# Patient Record
Sex: Female | Born: 1975 | Race: Asian | Hispanic: No | Marital: Married | Smoking: Never smoker
Health system: Southern US, Community
[De-identification: ages and names within clinical notes are randomized; demographics above are authoritative.]

## PROBLEM LIST (undated history)

## (undated) HISTORY — PX: APPENDECTOMY: SHX54

---

## 2020-07-06 ENCOUNTER — Ambulatory Visit: Payer: 59 | Admitting: Medical

## 2020-07-06 ENCOUNTER — Other Ambulatory Visit: Payer: Self-pay

## 2020-07-06 ENCOUNTER — Encounter: Payer: Self-pay | Admitting: Medical

## 2020-07-06 VITALS — BP 120/75 | HR 70 | Temp 97.5°F | Resp 20 | Ht 60.0 in | Wt 135.0 lb

## 2020-07-06 DIAGNOSIS — Z1239 Encounter for other screening for malignant neoplasm of breast: Secondary | ICD-10-CM

## 2020-07-06 DIAGNOSIS — Z0000 Encounter for general adult medical examination without abnormal findings: Secondary | ICD-10-CM

## 2020-07-06 NOTE — Patient Instructions (Addendum)
For you wellness exam today I have ordered cbc, cmp and  lipid panel.  Flu vaccine deferred. Pt wants to get on a Friday in future. If you got tetanus please give me dates.  Also sign release of information form so we can get old records.  Recommend exercise and healthy diet.  We will let you know lab results as they come in.  Bp was initiially high but then it was better on recheck. You can check bp at home occasionaly. If bp above 130/80 then beginning to have htn.  Follow up date appointment will be determined after lab review.    Preventive Care 74-46 Years Old, Female Preventive care refers to visits with your health care provider and lifestyle choices that can promote health and wellness. This includes:  A yearly physical exam. This may also be called an annual well check.  Regular dental visits and eye exams.  Immunizations.  Screening for certain conditions.  Healthy lifestyle choices, such as eating a healthy diet, getting regular exercise, not using drugs or products that contain nicotine and tobacco, and limiting alcohol use. What can I expect for my preventive care visit? Physical exam Your health care provider will check your:  Height and weight. This may be used to calculate body mass index (BMI), which tells if you are at a healthy weight.  Heart rate and blood pressure.  Skin for abnormal spots. Counseling Your health care provider may ask you questions about your:  Alcohol, tobacco, and drug use.  Emotional well-being.  Home and relationship well-being.  Sexual activity.  Eating habits.  Work and work Statistician.  Method of birth control.  Menstrual cycle.  Pregnancy history. What immunizations do I need?  Influenza (flu) vaccine  This is recommended every year. Tetanus, diphtheria, and pertussis (Tdap) vaccine  You may need a Td booster every 10 years. Varicella (chickenpox) vaccine  You may need this if you have not been  vaccinated. Zoster (shingles) vaccine  You may need this after age 46. Measles, mumps, and rubella (MMR) vaccine  You may need at least one dose of MMR if you were born in 1957 or later. You may also need a second dose. Pneumococcal conjugate (PCV13) vaccine  You may need this if you have certain conditions and were not previously vaccinated. Pneumococcal polysaccharide (PPSV23) vaccine  You may need one or two doses if you smoke cigarettes or if you have certain conditions. Meningococcal conjugate (MenACWY) vaccine  You may need this if you have certain conditions. Hepatitis A vaccine  You may need this if you have certain conditions or if you travel or work in places where you may be exposed to hepatitis A. Hepatitis B vaccine  You may need this if you have certain conditions or if you travel or work in places where you may be exposed to hepatitis B. Haemophilus influenzae type b (Hib) vaccine  You may need this if you have certain conditions. Human papillomavirus (HPV) vaccine  If recommended by your health care provider, you may need three doses over 6 months. You may receive vaccines as individual doses or as more than one vaccine together in one shot (combination vaccines). Talk with your health care provider about the risks and benefits of combination vaccines. What tests do I need? Blood tests  Lipid and cholesterol levels. These may be checked every 5 years, or more frequently if you are over 46 years old.  Hepatitis C test.  Hepatitis B test. Screening  Lung cancer  screening. You may have this screening every year starting at age 46 if you have a 30-pack-year history of smoking and currently smoke or have quit within the past 15 years.  Colorectal cancer screening. All adults should have this screening starting at age 30 and continuing until age 46. Your health care provider may recommend screening at age 46 if you are at increased risk. You will have tests every  1-10 years, depending on your results and the type of screening test.  Diabetes screening. This is done by checking your blood sugar (glucose) after you have not eaten for a while (fasting). You may have this done every 1-3 years.  Mammogram. This may be done every 1-2 years. Talk with your health care provider about when you should start having regular mammograms. This may depend on whether you have a family history of breast cancer.  BRCA-related cancer screening. This may be done if you have a family history of breast, ovarian, tubal, or peritoneal cancers.  Pelvic exam and Pap test. This may be done every 3 years starting at age 46. Starting at age 46, this may be done every 5 years if you have a Pap test in combination with an HPV test. Other tests  Sexually transmitted disease (STD) testing.  Bone density scan. This is done to screen for osteoporosis. You may have this scan if you are at high risk for osteoporosis. Follow these instructions at home: Eating and drinking  Eat a diet that includes fresh fruits and vegetables, whole grains, lean protein, and low-fat dairy.  Take vitamin and mineral supplements as recommended by your health care provider.  Do not drink alcohol if: ? Your health care provider tells you not to drink. ? You are pregnant, may be pregnant, or are planning to become pregnant.  If you drink alcohol: ? Limit how much you have to 0-1 drink a day. ? Be aware of how much alcohol is in your drink. In the [place] one drink equals one 12 oz bottle of beer (355 mL), one 5 oz glass of wine (148 mL), or one 1 oz glass of hard liquor (44 mL). Lifestyle  Take daily care of your teeth and gums.  Stay active. Exercise for at least 30 minutes on 5 or more days each week.  Do not use any products that contain nicotine or tobacco, such as cigarettes, e-cigarettes, and chewing tobacco. If you need help quitting, ask your health care provider.  If you are sexually active,  practice safe sex. Use a condom or other form of birth control (contraception) in order to prevent pregnancy and STIs (sexually transmitted infections).  If told by your health care provider, take low-dose aspirin daily starting at age 38. What's next?  Visit your health care provider once a year for a well check visit.  Ask your health care provider how often you should have your eyes and teeth checked.  Stay up to date on all vaccines. This information is not intended to replace advice given to you by your health care provider. Make sure you discuss any questions you have with your health care provider. Document Revised: 05/17/2018 Document Reviewed: 05/17/2018 Elsevier Patient Education  2020 [employer].

## 2020-07-06 NOTE — Progress Notes (Signed)
Subjective:    Patient ID: Sara Rogers, female    DOB: [birth year], 44 y.o.   MRN: [id number]  HPI Pt in for first time. Pt move to area from [place] in April 2021. Husband job change.  Pt works in Adult nurse. Pt states exercise apart from work. Pt states she has healthy diet. Does each fruits and vegetables. Does not each meat a lot. Occasional rare soda. Pt does not smoke.   Pt does not take any medications. No hx of known medical problems.  Pt initially mild elevated. BP today.  Pt had covid vaccine. January 04, 2020 and Jan 28, 2020.  Pt declines flu vaccine.  Pt states last physical was 2 years.  Pt is fasting. She will get wellness today.  Pt last pap was 2 years ago. It was normal.  Pt states her mammogram was negative as well. She is due for repeat. Last one 2 years ago and normal.  LMP- Expecting any day now. Was on 06/06/2020.   Review of Systems  Constitutional: Negative for chills, fatigue and fever.  Respiratory: Negative for cough, chest tightness, shortness of breath and wheezing.   Cardiovascular: Negative for chest pain and palpitations.  Gastrointestinal: Negative for abdominal pain.  Genitourinary: Negative for dysuria, flank pain and frequency.  Musculoskeletal: Negative for back pain and neck pain.  Skin: Negative for rash.  Neurological: Negative for dizziness, weakness, numbness and headaches.  Hematological: Negative for adenopathy. Does not bruise/bleed easily.  Psychiatric/Behavioral: Negative for behavioral problems and confusion.    History reviewed. No pertinent past medical history.   Social History   Socioeconomic History  . Marital status: Married    Spouse name: Not on file  . Number of children: Not on file  . Years of education: Not on file  . Highest education level: Not on file  Occupational History  . Occupation: foam packaging.  Tobacco Use  . Smoking status: Never Smoker  . Smokeless tobacco: Never Used  Substance and Sexual  Activity  . Alcohol use: Yes    Comment: very rare glass of red wine every 2-3 months at most.  . Drug use: Never  . Sexual activity: Yes  Other Topics Concern  . Not on file  Social History Narrative  . Not on file   Social Determinants of Health   Financial Resource Strain:   . Difficulty of Paying Living Expenses: Not on file  Food Insecurity:   . Worried About Charity fundraiser in the Last Year: Not on file  . Ran Out of Food in the Last Year: Not on file  Transportation Needs:   . Lack of Transportation (Medical): Not on file  . Lack of Transportation (Non-Medical): Not on file  Physical Activity:   . Days of Exercise per Week: Not on file  . Minutes of Exercise per Session: Not on file  Stress:   . Feeling of Stress : Not on file  Social Connections:   . Frequency of Communication with Friends and Family: Not on file  . Frequency of Social Gatherings with Friends and Family: Not on file  . Attends Religious Services: Not on file  . Active Member of Clubs or Organizations: Not on file  . Attends Archivist Meetings: Not on file  . Marital Status: Not on file  Intimate Partner Violence:   . Fear of Current or Ex-Partner: Not on file  . Emotionally Abused: Not on file  . Physically Abused: Not on file  .  Sexually Abused: Not on file    Past Surgical History:  Procedure Laterality Date  . APPENDECTOMY      History reviewed. No pertinent family history.  Not on File  No current outpatient medications on file prior to visit.   No current facility-administered medications on file prior to visit.    BP (!) 141/84   Pulse 70   Temp (!) 97.5 F (36.4 C) (Oral)   Resp 20   Ht 5' (1.524 m)   Wt 135 lb (61.2 kg)   LMP 06/06/2020   SpO2 99%   BMI 26.37 kg/m       Objective:   Physical Exam   General Mental Status- Alert. General Appearance- Not in acute distress.   Skin General: Color- Normal Color. Moisture- Normal Moisture. No worrisome  moles or lesions.  Neck Carotid Arteries- Normal color. Moisture- Normal Moisture. No carotid bruits. No JVD.  Chest and Lung Exam Auscultation: Breath Sounds:-Normal.  Cardiovascular Auscultation:Rythm- Regular. Murmurs & Other Heart Sounds:Auscultation of the heart reveals- No Murmurs.  Abdomen Inspection:-Inspeection Normal. Palpation/Percussion:Note:No mass. Palpation and Percussion of the abdomen reveal- Non Tender, Non Distended + BS, no rebound or guarding.    Neurologic Cranial Nerve exam:- CN III-XII intact(No nystagmus), symmetric smile. Strength:- 5/5 equal and symmetric strength both upper and lower extremities.     Assessment & Plan:  For you wellness exam today I have ordered cbc, cmp and  lipid panel.  Flu vaccine deferred. Pt wants to get on a Friday in future. If you got tetanus please give me dates.  Also sign release of information form so we can get old records.  Recommend exercise and healthy diet.  We will let you know lab results as they come in.  Bp was initiially high but then it was better on recheck. You can check bp at home occasionaly. If bp above 130/80 then beginning to have htn.  Follow up date appointment will be determined after lab review.

## 2020-07-07 LAB — CBC WITH DIFFERENTIAL/PLATELET
Absolute Monocytes: 262 cells/uL (ref 200–950)
Basophils Absolute: 41 cells/uL (ref 0–200)
Basophils Relative: 1 %
Eosinophils Absolute: 422 cells/uL (ref 15–500)
Eosinophils Relative: 10.3 %
HCT: 42.8 % (ref 35.0–45.0)
Hemoglobin: 14 g/dL (ref 11.7–15.5)
Lymphs Abs: 1365 cells/uL (ref 850–3900)
MCH: 28.1 pg (ref 27.0–33.0)
MCHC: 32.7 g/dL (ref 32.0–36.0)
MCV: 85.8 fL (ref 80.0–100.0)
MPV: 12.1 fL (ref 7.5–12.5)
Monocytes Relative: 6.4 %
Neutro Abs: 2009 cells/uL (ref 1500–7800)
Neutrophils Relative %: 49 %
Platelets: 227 10*3/uL (ref 140–400)
RBC: 4.99 10*6/uL (ref 3.80–5.10)
RDW: 13.7 % (ref 11.0–15.0)
Total Lymphocyte: 33.3 %
WBC: 4.1 10*3/uL (ref 3.8–10.8)

## 2020-07-07 LAB — COMPREHENSIVE METABOLIC PANEL
AG Ratio: 1.5 (calc) (ref 1.0–2.5)
ALT: 39 U/L — ABNORMAL HIGH (ref 6–29)
AST: 26 U/L (ref 10–35)
Albumin: 4.6 g/dL (ref 3.6–5.1)
Alkaline phosphatase (APISO): 91 U/L (ref 31–125)
BUN: 11 mg/dL (ref 7–25)
CO2: 28 mmol/L (ref 20–32)
Calcium: 10 mg/dL (ref 8.6–10.2)
Chloride: 104 mmol/L (ref 98–110)
Creat: 0.66 mg/dL (ref 0.50–1.10)
Globulin: 3 g/dL (calc) (ref 1.9–3.7)
Glucose, Bld: 87 mg/dL (ref 65–99)
Potassium: 4.7 mmol/L (ref 3.5–5.3)
Sodium: 140 mmol/L (ref 135–146)
Total Bilirubin: 0.5 mg/dL (ref 0.2–1.2)
Total Protein: 7.6 g/dL (ref 6.1–8.1)

## 2020-07-07 LAB — LIPID PANEL
Cholesterol: 243 mg/dL — ABNORMAL HIGH (ref <200)
HDL: 71 mg/dL (ref >=50)
LDL Cholesterol (Calc): 146 mg/dL (calc) — ABNORMAL HIGH
Non-HDL Cholesterol (Calc): 172 mg/dL (calc) — ABNORMAL HIGH (ref <130)
Total CHOL/HDL Ratio: 3.4 (calc) (ref <5.0)
Triglycerides: 136 mg/dL (ref <150)

## 2020-08-18 ENCOUNTER — Ambulatory Visit (HOSPITAL_BASED_OUTPATIENT_CLINIC_OR_DEPARTMENT_OTHER): Payer: 59

## 2021-03-31 ENCOUNTER — Other Ambulatory Visit (HOSPITAL_BASED_OUTPATIENT_CLINIC_OR_DEPARTMENT_OTHER): Payer: Self-pay

## 2021-03-31 MED ORDER — SULFAMETHOXAZOLE-TRIMETHOPRIM 800-160 MG PO TABS
ORAL_TABLET | ORAL | 0 refills | Status: DC
Start: 2021-03-31 — End: 2021-11-23
  Filled 2021-03-31: qty 14, 7d supply, fill #0

## 2021-03-31 MED ORDER — HYDROCODONE-ACETAMINOPHEN 5-325 MG PO TABS
ORAL_TABLET | ORAL | 0 refills | Status: DC
Start: 2021-03-31 — End: 2021-11-23
  Filled 2021-03-31: qty 12, 3d supply, fill #0

## 2021-04-08 ENCOUNTER — Other Ambulatory Visit (HOSPITAL_BASED_OUTPATIENT_CLINIC_OR_DEPARTMENT_OTHER): Payer: Self-pay

## 2021-04-08 MED ORDER — CEPHALEXIN 500 MG PO CAPS
ORAL_CAPSULE | ORAL | 0 refills | Status: DC
  Filled 2021-04-08: qty 15, 5d supply, fill #0

## 2021-04-19 ENCOUNTER — Ambulatory Visit: Payer: 59 | Admitting: Medical

## 2021-05-31 ENCOUNTER — Telehealth: Payer: Self-pay | Admitting: Medical

## 2021-05-31 NOTE — Telephone Encounter (Signed)
Patient would like to [facility] to a female doctor  Can we do this for her? \

## 2021-06-01 ENCOUNTER — Ambulatory Visit: Payer: 59 | Admitting: Medical

## 2021-06-08 NOTE — Telephone Encounter (Signed)
Scheduled for Dec Patient is aware that if she needs anything it will be [place] first until PCP is laura

## 2021-09-14 ENCOUNTER — Encounter: Payer: 59 | Admitting: Family

## 2021-11-23 ENCOUNTER — Ambulatory Visit (INDEPENDENT_AMBULATORY_CARE_PROVIDER_SITE_OTHER): Payer: 59 | Admitting: Family

## 2021-11-23 ENCOUNTER — Other Ambulatory Visit (HOSPITAL_COMMUNITY)
Admission: RE | Admit: 2021-11-23 | Discharge: 2021-11-23 | Disposition: A | Discharge status: Home / Self Care | Payer: 59 | Source: Ambulatory Visit | Attending: Family | Admitting: Family

## 2021-11-23 VITALS — BP 110/64 | HR 75 | Temp 97.5°F | Resp 18 | Ht 60.0 in | Wt 135.4 lb

## 2021-11-23 DIAGNOSIS — Z124 Encounter for screening for malignant neoplasm of cervix: Secondary | ICD-10-CM | POA: Insufficient documentation

## 2021-11-23 DIAGNOSIS — Z0000 Encounter for general adult medical examination without abnormal findings: Secondary | ICD-10-CM | POA: Diagnosis not present

## 2021-11-23 DIAGNOSIS — Z13220 Encounter for screening for lipoid disorders: Secondary | ICD-10-CM | POA: Diagnosis not present

## 2021-11-23 DIAGNOSIS — Z1231 Encounter for screening mammogram for malignant neoplasm of breast: Secondary | ICD-10-CM | POA: Diagnosis not present

## 2021-11-23 DIAGNOSIS — Z1211 Encounter for screening for malignant neoplasm of colon: Secondary | ICD-10-CM | POA: Diagnosis not present

## 2021-11-23 LAB — COMPREHENSIVE METABOLIC PANEL
ALT: 31 U/L (ref 0–35)
AST: 21 U/L (ref 0–37)
Albumin: 4.7 g/dL (ref 3.5–5.2)
Alkaline Phosphatase: 67 U/L (ref 39–117)
BUN: 14 mg/dL (ref 6–23)
CO2: 30 mEq/L (ref 19–32)
Calcium: 9.6 mg/dL (ref 8.4–10.5)
Chloride: 102 mEq/L (ref 96–112)
Creatinine, Ser: 0.53 mg/dL (ref 0.40–1.20)
GFR: 109.77 mL/min (ref >60.00)
Glucose, Bld: 83 mg/dL (ref 70–99)
Potassium: 3.9 mEq/L (ref 3.5–5.1)
Sodium: 139 mEq/L (ref 135–145)
Total Bilirubin: 0.8 mg/dL (ref 0.2–1.2)
Total Protein: 7.6 g/dL (ref 6.0–8.3)

## 2021-11-23 LAB — CBC WITH DIFFERENTIAL/PLATELET
Basophils Absolute: 0 10*3/uL (ref 0.0–0.1)
Basophils Relative: 0.7 % (ref 0.0–3.0)
Eosinophils Absolute: 0.3 10*3/uL (ref 0.0–0.7)
Eosinophils Relative: 7.2 % — ABNORMAL HIGH (ref 0.0–5.0)
HCT: 40.7 % (ref 36.0–46.0)
Hemoglobin: 13.5 g/dL (ref 12.0–15.0)
Lymphocytes Relative: 40 % (ref 12.0–46.0)
Lymphs Abs: 1.5 10*3/uL (ref 0.7–4.0)
MCHC: 33 g/dL (ref 30.0–36.0)
MCV: 86 fl (ref 78.0–100.0)
Monocytes Absolute: 0.3 10*3/uL (ref 0.1–1.0)
Monocytes Relative: 7.6 % (ref 3.0–12.0)
Neutro Abs: 1.6 10*3/uL (ref 1.4–7.7)
Neutrophils Relative %: 44.5 % (ref 43.0–77.0)
Platelets: 216 10*3/uL (ref 150.0–400.0)
RBC: 4.73 Mil/uL (ref 3.87–5.11)
RDW: 14.5 % (ref 11.5–15.5)
WBC: 3.7 10*3/uL — ABNORMAL LOW (ref 4.0–10.5)

## 2021-11-23 LAB — LIPID PANEL
Cholesterol: 254 mg/dL — ABNORMAL HIGH (ref 0–200)
HDL: 78.5 mg/dL (ref >39.00)
LDL Cholesterol: 156 mg/dL — ABNORMAL HIGH (ref 0–99)
NonHDL: 175.37
Total CHOL/HDL Ratio: 3
Triglycerides: 99 mg/dL (ref 0.0–149.0)
VLDL: 19.8 mg/dL (ref 0.0–40.0)

## 2021-11-23 NOTE — Progress Notes (Signed)
?Sara Rogers is a 46 y.o. female with the following history as recorded in EpicCare:  ?There are no problems to display for this patient. ?  ?No current outpatient medications on file.  ? ?No current facility-administered medications for this visit.  ?  ?Allergies: Diphenhydramine and Sulfa antibiotics  ?No past medical history on file.  ?Past Surgical History:  ?Procedure Laterality Date  ? APPENDECTOMY    ?  ?No family history on file.  ?Social History  ? ?Tobacco Use  ? Smoking status: Never  ? Smokeless tobacco: Never  ?Substance Use Topics  ? Alcohol use: Yes  ?  Comment: very rare glass of red wine every 2-3 months at most.  ?  ?Subjective:  ?Presents for yearly CPE; no acute concerns today;  ?Overdue for mammogram, pap smear and colon cancer screening; ?Planning to get scheduled with dentist and eye doctor;  ? ?Review of Systems  ?Constitutional: Negative.   ?HENT: Negative.    ?Eyes: Negative.   ?Respiratory: Negative.    ?Cardiovascular: Negative.   ?Gastrointestinal: Negative.   ?Genitourinary: Negative.   ?Musculoskeletal: Negative.   ?Skin: Negative.   ?Neurological: Negative.   ?Endo/Heme/Allergies: Negative.   ?Psychiatric/Behavioral: Negative.    ? ? ? ? ? ?Objective:  ?Vitals:  ? 11/23/21 0900  ?BP: 110/64  ?Pulse: 75  ?Resp: 18  ?Temp: (!) 97.5 ?F (36.4 ?C)  ?TempSrc: Oral  ?SpO2: 99%  ?Weight: 135 lb 6.4 oz (61.4 kg)  ?Height: 5' (1.524 m)  ?  ?General: Well developed, well nourished, in no acute distress  ?Skin : Warm and dry.  ?Head: Normocephalic and atraumatic  ?Eyes: Sclera and conjunctiva clear; pupils round and reactive to light; extraocular movements intact  ?Ears: External normal; canals clear; tympanic membranes normal  ?Oropharynx: Pink, supple. No suspicious lesions  ?Neck: Supple without thyromegaly, adenopathy  ?Lungs: Respirations unlabored; clear to auscultation bilaterally without wheeze, rales, rhonchi  ?CVS exam: normal rate and regular rhythm.  ?Abdomen: Soft; nontender;  nondistended; normoactive bowel sounds; no masses or hepatosplenomegaly  ?Musculoskeletal: No deformities; no active joint inflammation  ?Extremities: No edema, cyanosis, clubbing  ?Vessels: Symmetric bilaterally  ?Neurologic: Alert and oriented; speech intact; face symmetrical; moves all extremities well; CNII-XII intact without focal deficit  ?Pelvic exam: normal external genitalia, vulva, vagina, cervix, uterus and adnexa. ? ? ?Assessment:  ?1. PE (physical exam), annual   ?2. Visit for screening mammogram   ?3. Colon cancer screening   ?4. Lipid screening   ?5. Cervical cancer screening   ?  ?Plan:  ?Age appropriate preventive healthcare needs addressed; encouraged regular eye doctor and dental exams; encouraged regular exercise; will update labs and refills as needed today; follow-up to be determined; ?Mammogram, cologuard and Thin Prep pap collected today; ? ?This visit occurred during the SARS-CoV-2 public health emergency.  Safety protocols were in place, including screening questions prior to the visit, additional usage of staff PPE, and extensive cleaning of exam room while observing appropriate contact time as indicated for disinfecting solutions.  ? ? ?No follow-ups on file.  ?Orders Placed This Encounter  ?Procedures  ? MM Digital Screening  ?  Standing Status:   Future  ?  Standing Expiration Date:   11/24/2022  ?  Order Specific Question:   Reason for Exam (SYMPTOM  OR DIAGNOSIS REQUIRED)  ?  Answer:   screening mammogram  ?  Order Specific Question:   Is the patient pregnant?  ?  Answer:   No  ?  Order Specific Question:  Preferred imaging location?  ?  Answer:   [facility]  ? Cologuard  ? CBC with Differential/Platelet  ? Comp Met (CMET)  ? Lipid panel  ?  ?Requested Prescriptions  ? ? No prescriptions requested or ordered in this encounter  ?  ? ?

## 2021-11-25 LAB — CYTOLOGY - PAP
Comment: NEGATIVE
Diagnosis: NEGATIVE
High risk HPV: NEGATIVE

## 2021-11-29 ENCOUNTER — Encounter (HOSPITAL_BASED_OUTPATIENT_CLINIC_OR_DEPARTMENT_OTHER): Payer: Self-pay

## 2021-11-29 ENCOUNTER — Other Ambulatory Visit: Payer: Self-pay

## 2021-11-29 ENCOUNTER — Ambulatory Visit (HOSPITAL_BASED_OUTPATIENT_CLINIC_OR_DEPARTMENT_OTHER)
Admission: RE | Admit: 2021-11-29 | Discharge: 2021-11-29 | Disposition: A | Discharge status: Home / Self Care | Payer: 59 | Source: Ambulatory Visit | Attending: Family | Admitting: Family

## 2021-11-29 DIAGNOSIS — Z1231 Encounter for screening mammogram for malignant neoplasm of breast: Secondary | ICD-10-CM | POA: Diagnosis present

## 2022-01-08 LAB — COLOGUARD: COLOGUARD: NEGATIVE

## 2022-03-01 ENCOUNTER — Encounter: Payer: Self-pay | Admitting: Family

## 2022-03-01 ENCOUNTER — Other Ambulatory Visit (HOSPITAL_BASED_OUTPATIENT_CLINIC_OR_DEPARTMENT_OTHER): Payer: Self-pay

## 2022-03-01 ENCOUNTER — Ambulatory Visit (INDEPENDENT_AMBULATORY_CARE_PROVIDER_SITE_OTHER): Payer: 59 | Admitting: Family

## 2022-03-01 VITALS — BP 137/82 | HR 82 | Temp 97.7°F | Resp 12 | Ht 60.0 in | Wt 140.2 lb

## 2022-03-01 DIAGNOSIS — L259 Unspecified contact dermatitis, unspecified cause: Secondary | ICD-10-CM

## 2022-03-01 MED ORDER — CLOBETASOL PROPIONATE 0.05 % EX OINT
1.0000 "application " | TOPICAL_OINTMENT | Freq: Two times a day (BID) | CUTANEOUS | 0 refills | Status: DC
  Filled 2022-03-01: qty 30, 15d supply, fill #0

## 2022-03-01 MED ORDER — DOXYCYCLINE HYCLATE 100 MG PO TABS
100.0000 mg | ORAL_TABLET | Freq: Two times a day (BID) | ORAL | 0 refills | Status: DC
  Filled 2022-03-01: qty 14, 7d supply, fill #0

## 2022-03-01 MED ORDER — METHYLPREDNISOLONE ACETATE 40 MG/ML IJ SUSP
40.0000 mg | Freq: Once | INTRAMUSCULAR | Status: AC
  Administered 2022-03-01: 40 mg via INTRAMUSCULAR

## 2022-03-01 MED ORDER — HYDROXYZINE HCL 10 MG PO TABS
10.0000 mg | ORAL_TABLET | Freq: Every evening | ORAL | 0 refills | Status: DC | PRN
  Filled 2022-03-01: qty 30, 30d supply, fill #0

## 2022-03-01 NOTE — Progress Notes (Signed)
  Sara Rogers is a 46 y.o. female with the following history as recorded in EpicCare:  There are no problems to display for this patient.   Current Outpatient Medications  Medication Sig Dispense Refill   clobetasol ointment (TEMOVATE) 8.52 % Apply 1 application on to the skin 2 (two) times daily. 30 g 0   doxycycline (VIBRA-TABS) 100 MG tablet Take 1 tablet (100 mg total) by mouth 2 (two) times daily. 14 tablet 0   hydrOXYzine (ATARAX) 10 MG tablet Take 1 tablet (10 mg total) by mouth at bedtime as needed for itching. 30 tablet 0   No current facility-administered medications for this visit.    Allergies: Diphenhydramine and Sulfa antibiotics  No past medical history on file.  Past Surgical History:  Procedure Laterality Date   APPENDECTOMY      No family history on file.  Social History   Tobacco Use   Smoking status: Never   Smokeless tobacco: Never  Substance Use Topics   Alcohol use: Yes    Comment: very rare glass of red wine every 2-3 months at most.    Subjective:  Was working in yard over the weekend; suspects she is having allergic reaction to possible poison ivy; + itching/ breaking out on upper extremities; had similar reaction last fall;     Objective:  Vitals:   03/01/22 1530  BP: 137/82  Pulse: 82  Resp: 12  Temp: 97.7 F (36.5 C)  TempSrc: Oral  SpO2: 98%  Weight: 140 lb 3.2 oz (63.6 kg)  Height: 5' (1.524 m)    General: Well developed, well nourished, in no acute distress  Skin : Warm and dry. Vesicular/ erythematous lesions noted on bilateral upper extremities; Head: Normocephalic and atraumatic  Lungs: Respirations unlabored;  Neurologic: Alert and oriented; speech intact; face symmetrical; moves all extremities well; CNII-XII intact without focal deficit  Assessment:  1. Contact dermatitis, unspecified contact dermatitis type, unspecified trigger     Plan:  Depo-medrol IM 40 mg given; Rx for Doxycyline x 7 days- concerned for secondary infection  on right forearm; Rx for Atarax to use at night to help with itching; follow up worse, no better.   No follow-ups on file.  No orders of the defined types were placed in this encounter.   Requested Prescriptions   Signed Prescriptions Disp Refills   doxycycline (VIBRA-TABS) 100 MG tablet 14 tablet 0    Sig: Take 1 tablet (100 mg total) by mouth 2 (two) times daily.   clobetasol ointment (TEMOVATE) 0.05 % 30 g 0    Sig: Apply 1 application on to the skin 2 (two) times daily.   hydrOXYzine (ATARAX) 10 MG tablet 30 tablet 0    Sig: Take 1 tablet (10 mg total) by mouth at bedtime as needed for itching.

## 2023-05-21 IMAGING — MG MM DIGITAL SCREENING BILAT W/ TOMO AND CAD
8 series · 9 of 24 positions shown · non-contrast
Comparison: None.

CLINICAL DATA: Screening.

EXAM:
DIGITAL SCREENING BILATERAL MAMMOGRAM WITH TOMOSYNTHESIS AND CAD
TECHNIQUE: Bilateral screening digital craniocaudal and mediolateral oblique
mammograms were obtained. Bilateral screening digital breast
tomosynthesis was performed. The images were evaluated with
computer-aided detection.

[R MLO synth-2D]
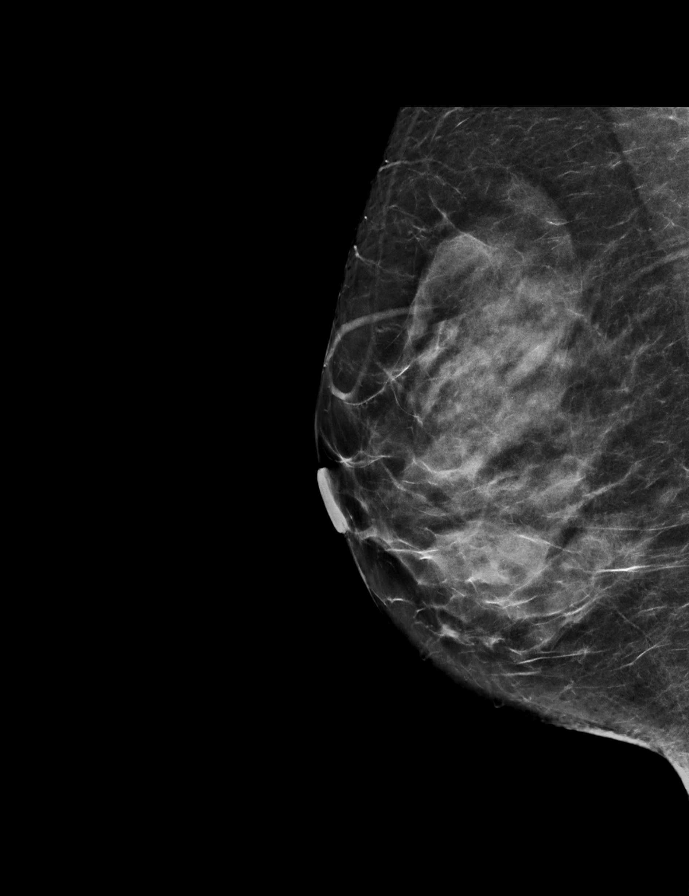

[R CC synth-2D]
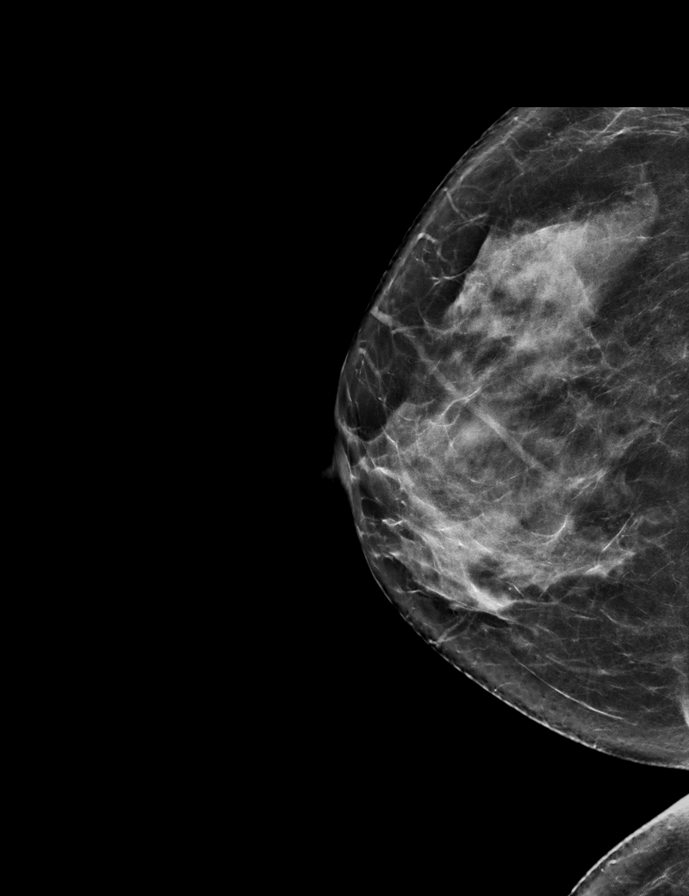

[L MLO synth-2D]
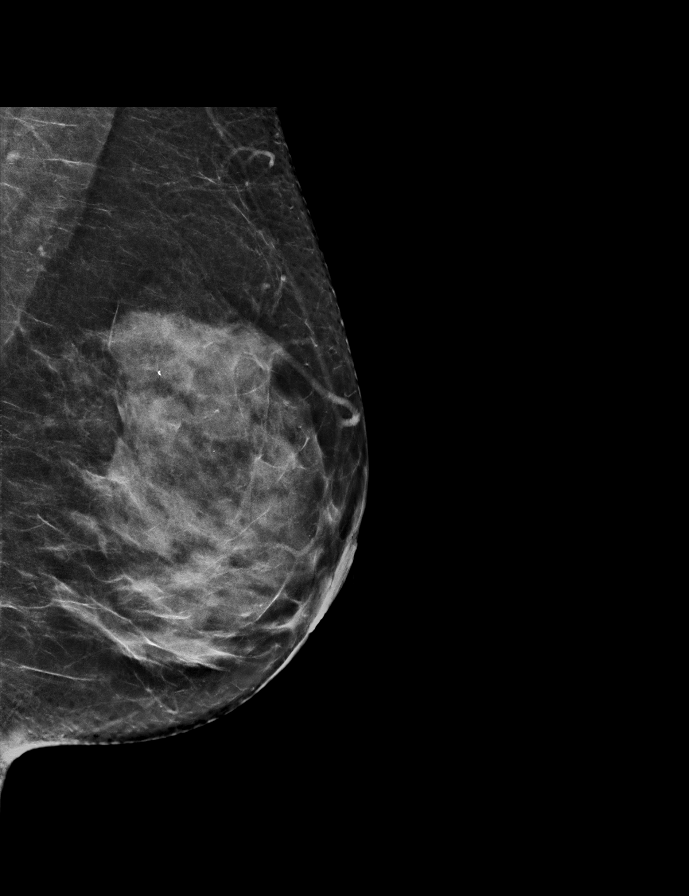

[L CC synth-2D]
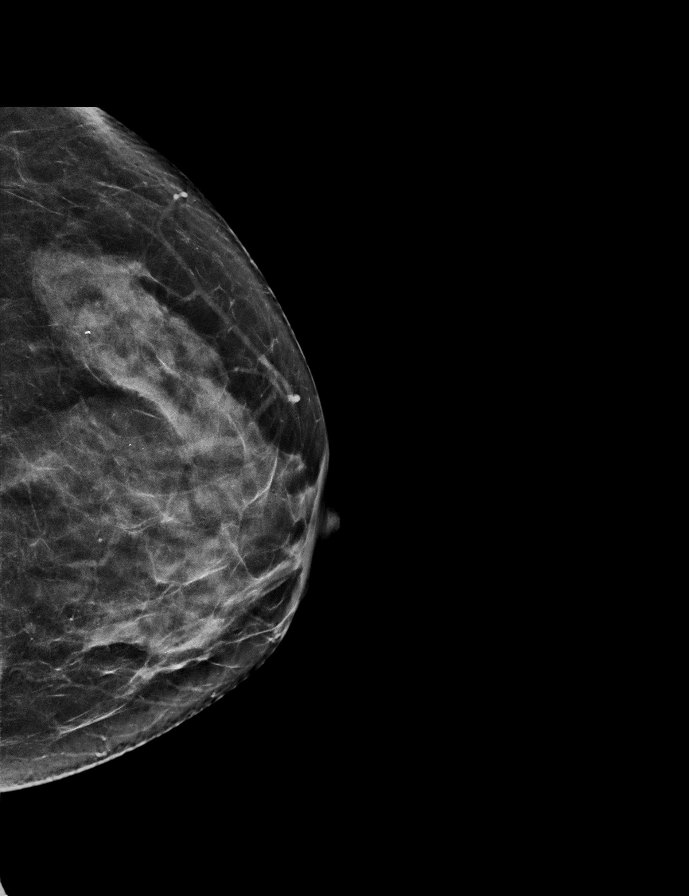

[L CC tomo · 2 of 62 frames shown]
[frame 21/62]
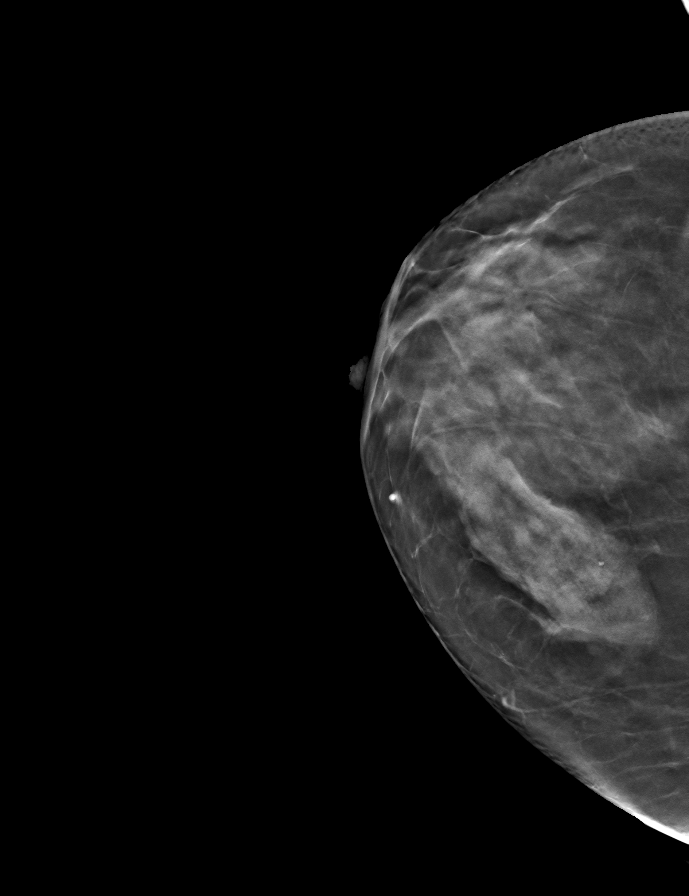
[frame 31/62]
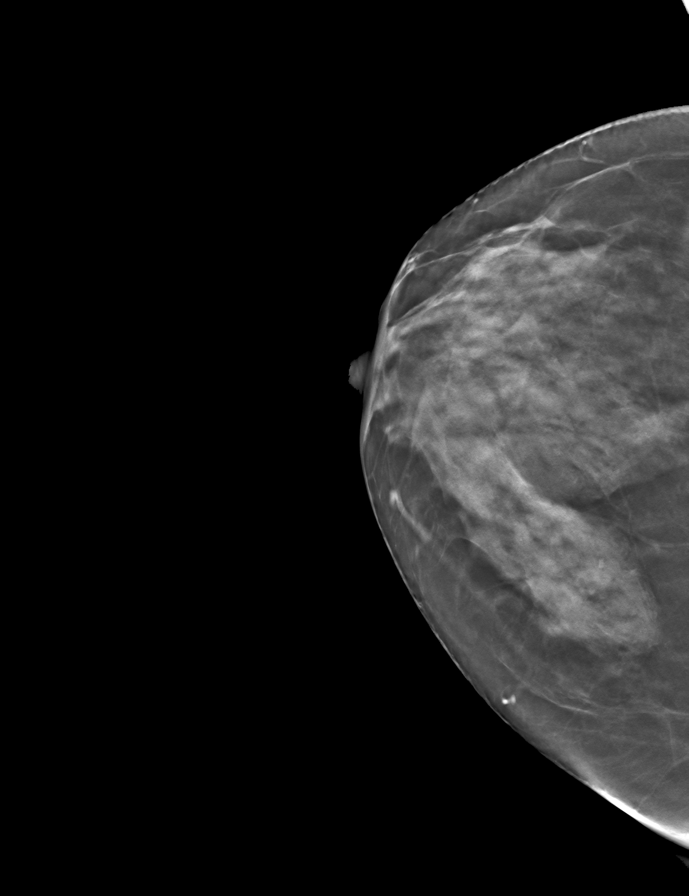

[L MLO tomo · tomo slice 34/67.0]
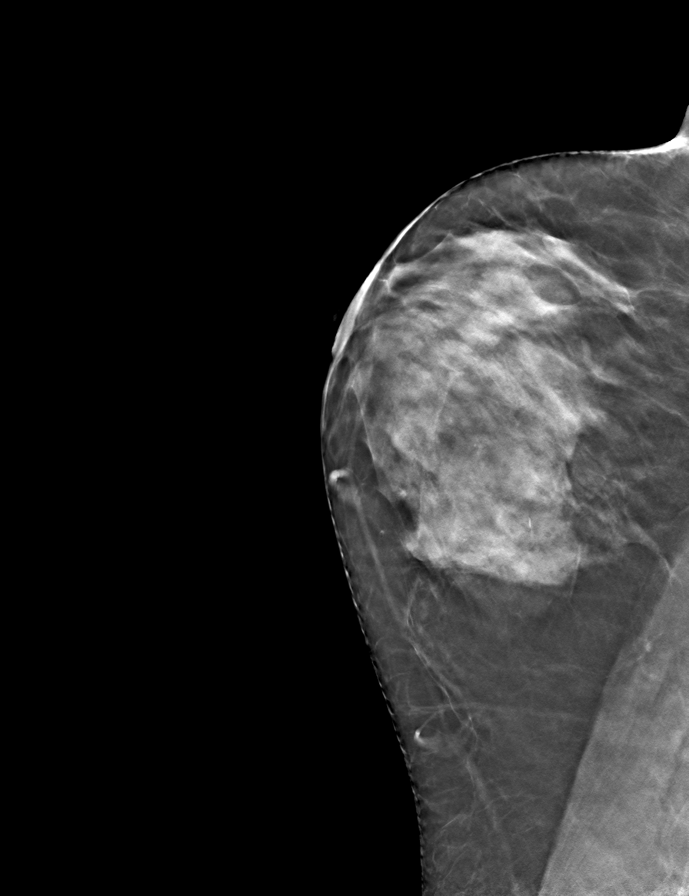

[R MLO tomo · tomo slice 35/69.0]
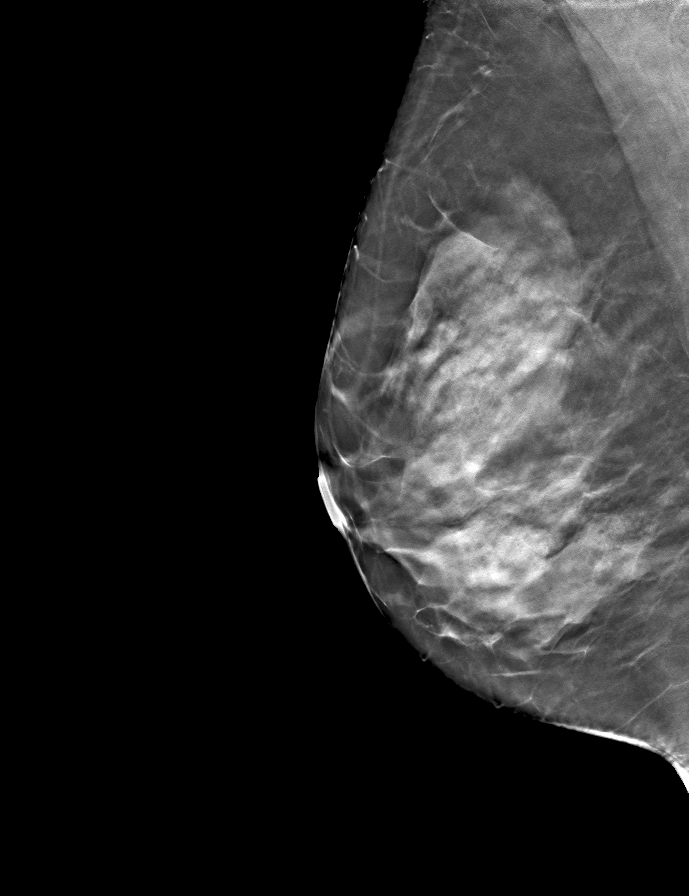

[R CC tomo · tomo slice 33/66.0]
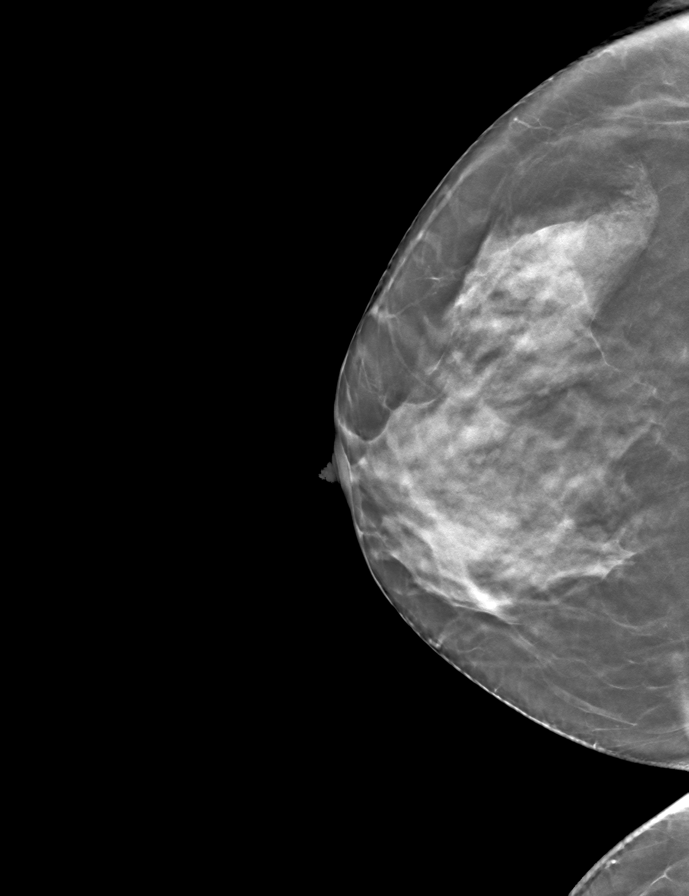

[9 of 24 positions shown; findings below may reference images not displayed]

ACR Breast Density Category d: The breast tissue is extremely dense,
which lowers the sensitivity of mammography.
FINDINGS: There are no findings suspicious for malignancy.
IMPRESSION: No mammographic evidence of malignancy. A result letter of this
screening mammogram will be mailed directly to the patient.

RECOMMENDATION:
Screening mammogram in one year. (Code:W3-Z-XIN)

BI-RADS CATEGORY  1: Negative.

## 2024-02-16 ENCOUNTER — Ambulatory Visit (INDEPENDENT_AMBULATORY_CARE_PROVIDER_SITE_OTHER): Admitting: Family

## 2024-02-16 VITALS — BP 118/82 | HR 70 | Ht 60.0 in | Wt 139.2 lb

## 2024-02-16 DIAGNOSIS — Z1231 Encounter for screening mammogram for malignant neoplasm of breast: Secondary | ICD-10-CM

## 2024-02-16 DIAGNOSIS — R42 Dizziness and giddiness: Secondary | ICD-10-CM | POA: Diagnosis not present

## 2024-02-16 LAB — CBC WITH DIFFERENTIAL/PLATELET
Absolute Lymphocytes: 1691 {cells}/uL (ref 850–3900)
Absolute Monocytes: 365 {cells}/uL (ref 200–950)
Basophils Absolute: 19 {cells}/uL (ref 0–200)
Basophils Relative: 0.5 %
Eosinophils Absolute: 129 {cells}/uL (ref 15–500)
Eosinophils Relative: 3.4 %
HCT: 42 % (ref 35.0–45.0)
Hemoglobin: 13.6 g/dL (ref 11.7–15.5)
MCH: 27.6 pg (ref 27.0–33.0)
MCHC: 32.4 g/dL (ref 32.0–36.0)
MCV: 85.4 fL (ref 80.0–100.0)
MPV: 11.7 fL (ref 7.5–12.5)
Monocytes Relative: 9.6 %
Neutro Abs: 1596 {cells}/uL (ref 1500–7800)
Neutrophils Relative %: 42 %
Platelets: 228 10*3/uL (ref 140–400)
RBC: 4.92 10*6/uL (ref 3.80–5.10)
RDW: 13.7 % (ref 11.0–15.0)
Total Lymphocyte: 44.5 %
WBC: 3.8 10*3/uL (ref 3.8–10.8)

## 2024-02-16 NOTE — Progress Notes (Signed)
  Sara Rogers is a 48 y.o. female with the following history as recorded in EpicCare:  There are no active problems to display for this patient.   No current outpatient medications on file.   No current facility-administered medications for this visit.    Allergies: Diphenhydramine and Sulfa  antibiotics  No past medical history on file.  Past Surgical History:  Procedure Laterality Date   APPENDECTOMY      No family history on file.  Social History   Tobacco Use   Smoking status: Never   Smokeless tobacco: Never  Substance Use Topics   Alcohol use: Yes    Comment: very rare glass of red wine every 2-3 months at most.    Subjective:   Patient notes that she had an episode of dizziness last week- started suddenly at work; lasted about 10 minutes- had not eaten that morning; notes that she ate a banana and felt better very quickly; no chest pain, no shortness of breath; did have a headache which followed after the episode;  Has had no further symptoms in the past week;    Objective:  Vitals:   02/16/24 1519  BP: 118/82  Pulse: 70  SpO2: 99%  Weight: 139 lb 3.2 oz (63.1 kg)  Height: 5' (1.524 m)    General: Well developed, well nourished, in no acute distress  Skin : Warm and dry.  Head: Normocephalic and atraumatic  Eyes: Sclera and conjunctiva clear; pupils round and reactive to light; extraocular movements intact  Ears: External normal; canals clear; tympanic membranes normal  Oropharynx: Pink, supple. No suspicious lesions  Neck: Supple without thyromegaly, adenopathy  Lungs: Respirations unlabored; clear to auscultation bilaterally without wheeze, rales, rhonchi  CVS exam: normal rate and regular rhythm.  Musculoskeletal: No deformities; no active joint inflammation  Extremities: No edema, cyanosis, clubbing  Vessels: Symmetric bilaterally  Neurologic: Alert and oriented; speech intact; face symmetrical; moves all extremities well; CNII-XII intact without focal deficit    Assessment:  1. Dizziness   2. Visit for screening mammogram     Plan:  Physical exam is reassuring; patient's description of symptoms are consistent with an episode of hypoglycemia; EKG shows sinus rhythm today; will update labs today- to consider referral to cardiology and/or imaging; follow up to be determined; Order updated- patient would like to get done in [place];   No follow-ups on file.  Orders Placed This Encounter  Procedures   MM Digital Screening    Standing Status:   Future    Expiration Date:   02/15/2025    Reason for Exam (SYMPTOM  OR DIAGNOSIS REQUIRED):   screening mammogram    Is the patient pregnant?:   No    Preferred imaging location?:   [facility] [place]   CBC with Differential/Platelet   Comp Met (CMET)   TSH   Hemoglobin A1c   B12   EKG 12-Lead    Requested Prescriptions    No prescriptions requested or ordered in this encounter

## 2024-02-16 NOTE — Addendum Note (Signed)
 Addended by: Dellie Fergusson on: 02/16/2024 04:01 PM   Modules accepted: Orders

## 2024-02-17 LAB — COMPREHENSIVE METABOLIC PANEL WITH GFR
AG Ratio: 1.8 (calc) (ref 1.0–2.5)
ALT: 25 U/L (ref 6–29)
AST: 23 U/L (ref 10–35)
Albumin: 5 g/dL (ref 3.6–5.1)
Alkaline phosphatase (APISO): 73 U/L (ref 37–153)
BUN: 14 mg/dL (ref 7–25)
CO2: 27 mmol/L (ref 20–32)
Calcium: 9.5 mg/dL (ref 8.6–10.4)
Chloride: 102 mmol/L (ref 98–110)
Creat: 0.64 mg/dL (ref 0.50–1.03)
Globulin: 2.8 g/dL (ref 1.9–3.7)
Glucose, Bld: 79 mg/dL (ref 65–99)
Potassium: 4.2 mmol/L (ref 3.5–5.3)
Sodium: 139 mmol/L (ref 135–146)
Total Bilirubin: 0.7 mg/dL (ref 0.2–1.2)
Total Protein: 7.8 g/dL (ref 6.1–8.1)
eGFR: 108 mL/min/{1.73_m2} (ref >=60)

## 2024-02-17 LAB — HEMOGLOBIN A1C
Hgb A1c MFr Bld: 5.5 % (ref <5.7)
Mean Plasma Glucose: 111 mg/dL
eAG (mmol/L): 6.2 mmol/L

## 2024-02-17 LAB — TSH: TSH: 2.54 m[IU]/L

## 2024-02-17 LAB — VITAMIN B12: Vitamin B-12: 531 pg/mL (ref 200–1100)

## 2024-02-19 ENCOUNTER — Other Ambulatory Visit: Payer: Self-pay | Admitting: Family

## 2024-02-19 ENCOUNTER — Ambulatory Visit: Payer: Self-pay | Admitting: Family

## 2024-02-19 DIAGNOSIS — R42 Dizziness and giddiness: Secondary | ICD-10-CM

## 2024-02-19 DIAGNOSIS — G4489 Other headache syndrome: Secondary | ICD-10-CM

## 2024-02-20 ENCOUNTER — Other Ambulatory Visit: Payer: Self-pay | Admitting: Family

## 2024-02-20 DIAGNOSIS — Z1231 Encounter for screening mammogram for malignant neoplasm of breast: Secondary | ICD-10-CM

## 2024-03-27 ENCOUNTER — Ambulatory Visit
Admission: RE | Admit: 2024-03-27 | Discharge: 2024-03-27 | Disposition: A | Discharge status: Home / Self Care | Source: Ambulatory Visit | Attending: Family | Admitting: Family

## 2024-03-27 DIAGNOSIS — Z1231 Encounter for screening mammogram for malignant neoplasm of breast: Secondary | ICD-10-CM | POA: Diagnosis present

## 2024-07-10 ENCOUNTER — Telehealth: Payer: Self-pay | Admitting: Family

## 2024-07-10 NOTE — Telephone Encounter (Signed)
 Copied from CRM [id number]. Topic: Appointments - Transfer of Care >> Jul 10, 2024  3:09 PM Eva FALCON wrote: Pt is requesting to transfer FROM: Leita Elbe FNP Pt is requesting to transfer TO: Eleanor Ponto NP Reason for requested transfer: Leita Elbe is no longer at [facility]. It is the responsibility of the team the patient would like to transfer to (Melissa O'Sullivan) to reach out to the patient if for any reason this transfer is not acceptable.

## 2024-07-11 NOTE — Telephone Encounter (Signed)
 Toc scheduled.

## 2024-10-18 ENCOUNTER — Encounter: Admitting: Family

## 2024-11-18 ENCOUNTER — Encounter: Admitting: Student
